# Patient Record
Sex: Male | Born: 1965 | Hispanic: No | Smoking: Never smoker
Health system: Southern US, Community
[De-identification: ages and names within clinical notes are randomized; demographics above are authoritative.]

## PROBLEM LIST (undated history)

## (undated) DIAGNOSIS — I10 Essential (primary) hypertension: Secondary | ICD-10-CM

## (undated) DIAGNOSIS — F32A Depression, unspecified: Secondary | ICD-10-CM

## (undated) DIAGNOSIS — F329 Major depressive disorder, single episode, unspecified: Secondary | ICD-10-CM

## (undated) DIAGNOSIS — E785 Hyperlipidemia, unspecified: Secondary | ICD-10-CM

## (undated) DIAGNOSIS — F419 Anxiety disorder, unspecified: Secondary | ICD-10-CM

## (undated) DIAGNOSIS — E119 Type 2 diabetes mellitus without complications: Secondary | ICD-10-CM

## (undated) DIAGNOSIS — M549 Dorsalgia, unspecified: Secondary | ICD-10-CM

## (undated) DIAGNOSIS — K219 Gastro-esophageal reflux disease without esophagitis: Secondary | ICD-10-CM

## (undated) HISTORY — DX: Type 2 diabetes mellitus without complications: E11.9

## (undated) HISTORY — DX: Essential (primary) hypertension: I10

## (undated) HISTORY — PX: OTHER SURGICAL HISTORY: SHX169

## (undated) HISTORY — DX: Dorsalgia, unspecified: M54.9

## (undated) HISTORY — DX: Hyperlipidemia, unspecified: E78.5

## (undated) HISTORY — DX: Depression, unspecified: F32.A

## (undated) HISTORY — DX: Gastro-esophageal reflux disease without esophagitis: K21.9

## (undated) HISTORY — DX: Anxiety disorder, unspecified: F41.9

## (undated) HISTORY — DX: Major depressive disorder, single episode, unspecified: F32.9

---

## 2009-01-23 ENCOUNTER — Ambulatory Visit: Payer: Self-pay | Admitting: Physician Assistant

## 2011-03-15 ENCOUNTER — Ambulatory Visit: Payer: Self-pay | Admitting: Family Medicine

## 2011-10-17 ENCOUNTER — Emergency Department: Payer: Self-pay | Admitting: *Deleted

## 2011-10-17 LAB — COMPREHENSIVE METABOLIC PANEL
Alkaline Phosphatase: 55 U/L (ref 50–136)
Anion Gap: 11 (ref 7–16)
Bilirubin,Total: 0.3 mg/dL (ref 0.2–1.0)
Calcium, Total: 9.1 mg/dL (ref 8.5–10.1)
Chloride: 103 mmol/L (ref 98–107)
Co2: 26 mmol/L (ref 21–32)
Creatinine: 1.59 mg/dL — ABNORMAL HIGH (ref 0.60–1.30)
SGOT(AST): 34 U/L (ref 15–37)
SGPT (ALT): 36 U/L
Sodium: 140 mmol/L (ref 136–145)

## 2011-10-17 LAB — URINALYSIS, COMPLETE: RBC,UR: 6 /HPF (ref 0–5)

## 2011-10-17 LAB — CBC
MCV: 86 fL (ref 80–100)
Platelet: 275 10*3/uL (ref 150–440)
RBC: 4.66 10*6/uL (ref 4.40–5.90)
RDW: 13.5 % (ref 11.5–14.5)
WBC: 10.1 10*3/uL (ref 3.8–10.6)

## 2011-10-17 LAB — LIPASE, BLOOD: Lipase: 215 U/L (ref 73–393)

## 2011-10-19 ENCOUNTER — Ambulatory Visit: Payer: Self-pay | Admitting: Urology

## 2011-10-19 LAB — BASIC METABOLIC PANEL
Anion Gap: 8 (ref 7–16)
BUN: 16 mg/dL (ref 7–18)
Calcium, Total: 9 mg/dL (ref 8.5–10.1)
Co2: 27 mmol/L (ref 21–32)
Creatinine: 0.89 mg/dL (ref 0.60–1.30)
EGFR (African American): >60
EGFR (Non-African Amer.): >60
Sodium: 140 mmol/L (ref 136–145)

## 2011-10-19 LAB — URINE CULTURE

## 2011-11-06 ENCOUNTER — Ambulatory Visit: Payer: Self-pay | Admitting: Urology

## 2011-11-30 ENCOUNTER — Ambulatory Visit: Payer: Self-pay | Admitting: Urology

## 2011-11-30 LAB — CREATININE, SERUM
Creatinine: 0.97 mg/dL (ref 0.60–1.30)
EGFR (African American): >60
EGFR (Non-African Amer.): >60

## 2012-01-17 ENCOUNTER — Ambulatory Visit: Payer: Self-pay | Admitting: Family Medicine

## 2012-02-22 ENCOUNTER — Ambulatory Visit: Payer: Self-pay | Admitting: Urology

## 2013-06-22 IMAGING — CT CT ABD-PELV W/O CM
1 of 2 series · 15 of 32 positions shown, 19 images · non-contrast
Comparison: none

REASON FOR EXAM: (1) right flank pain; (2) right flank pain
COMMENTS:   May transport without cardiac monitor

[Series 2: soft tissue · axial · 0.81mm/px · z∈[-52,+410]mm · 15 of 170 slices shown, 19 images]
[im 8/170  soft-tissue]
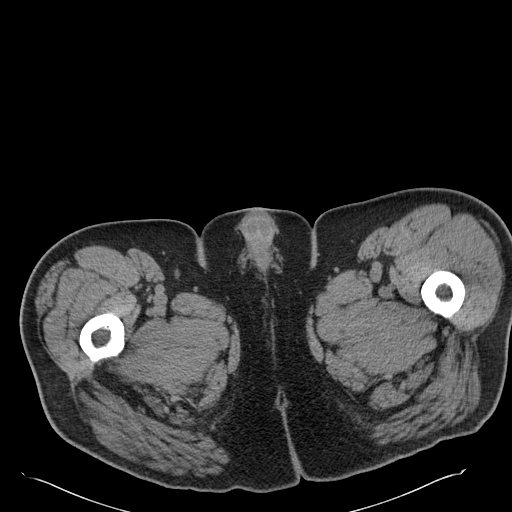
[im 8/170  bone]
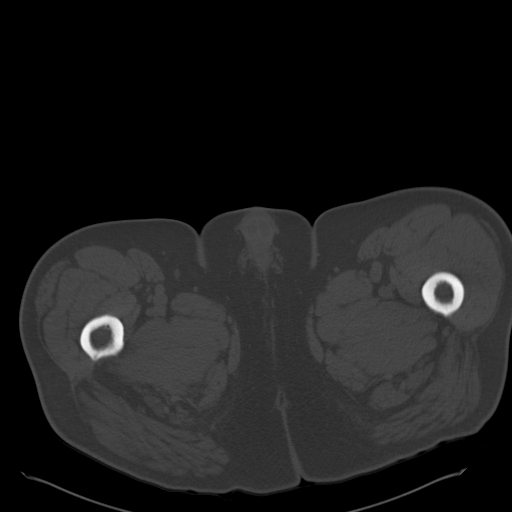
[im 22/170  soft-tissue]
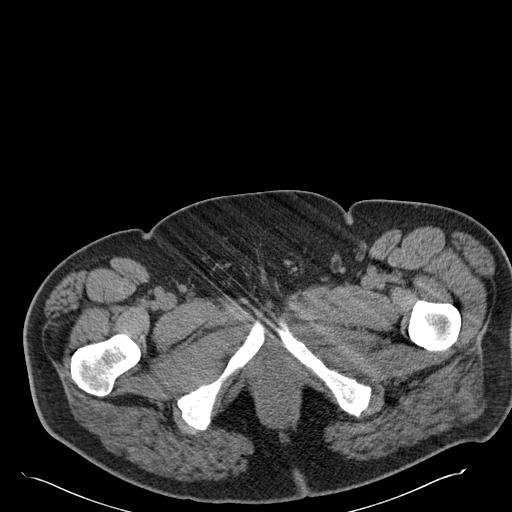
[im 36/170  soft-tissue]
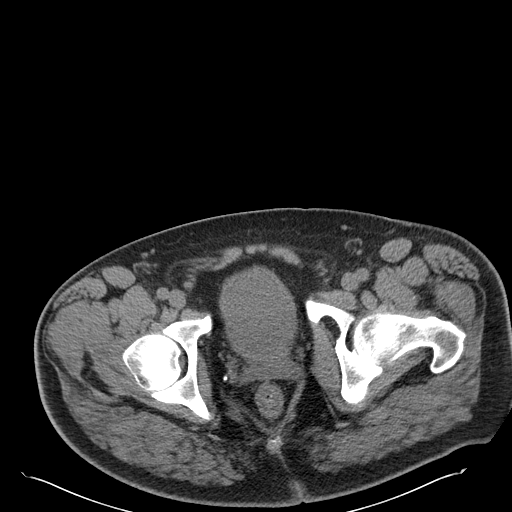
[im 50/170  soft-tissue]
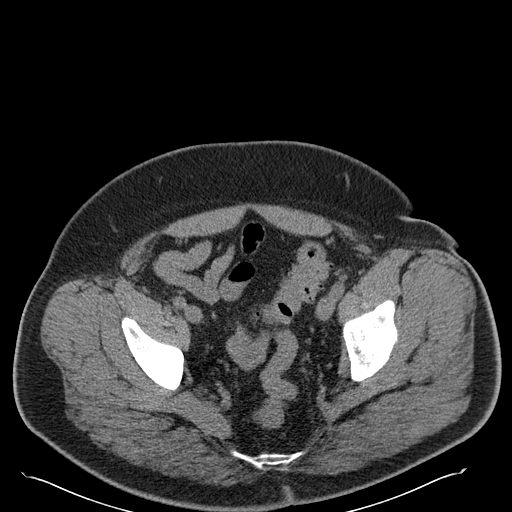
[im 57/170  soft-tissue]
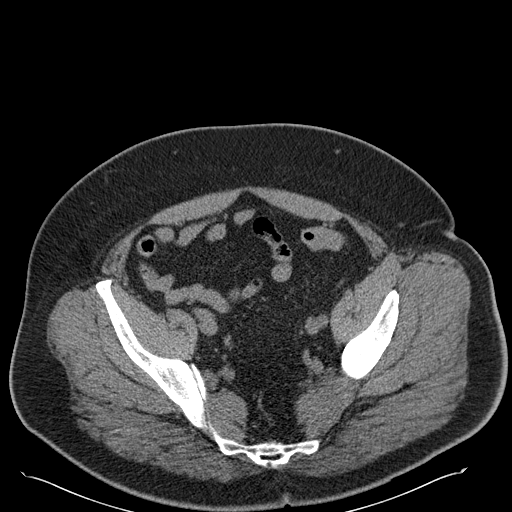
[im 71/170  soft-tissue]
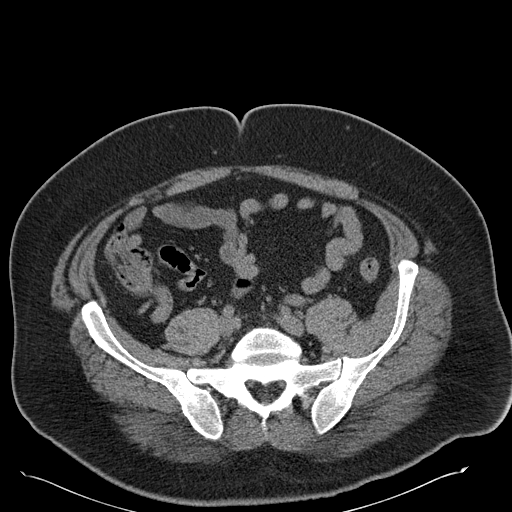
[im 85/170  soft-tissue]
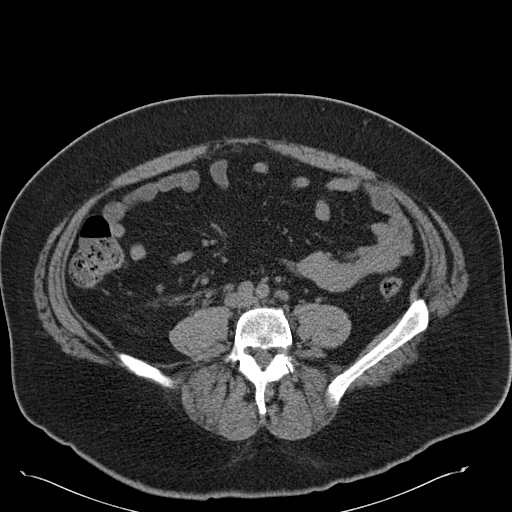
[im 99/170  soft-tissue]
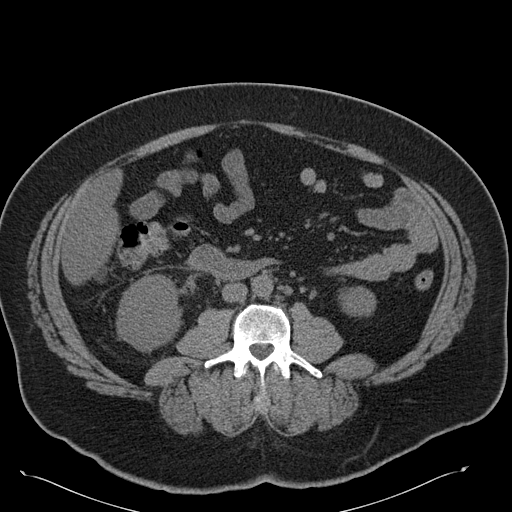
[im 113/170  soft-tissue]
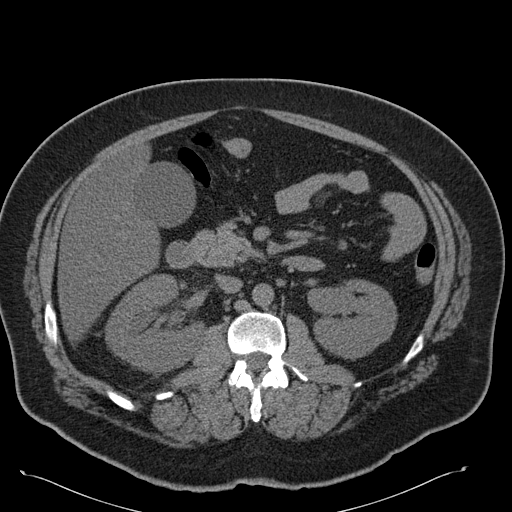
[im 113/170  bone]
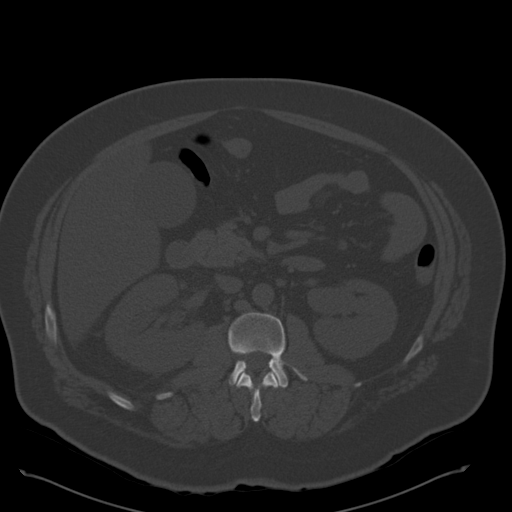
[im 120/170  soft-tissue]
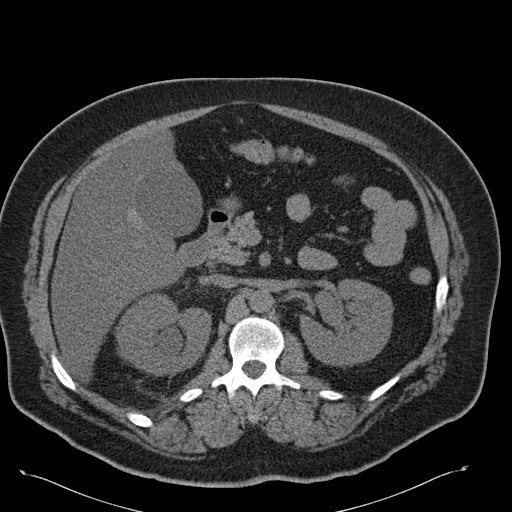
[im 134/170  soft-tissue]
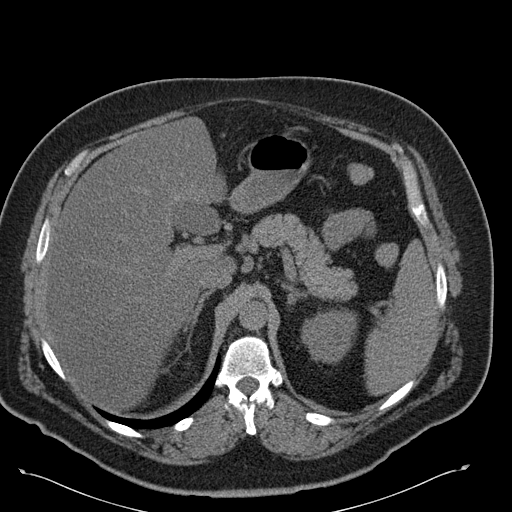
[im 141/170  lung]
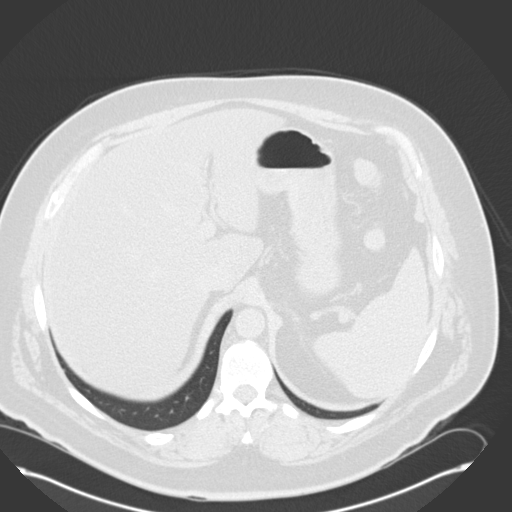
[im 148/170  soft-tissue]
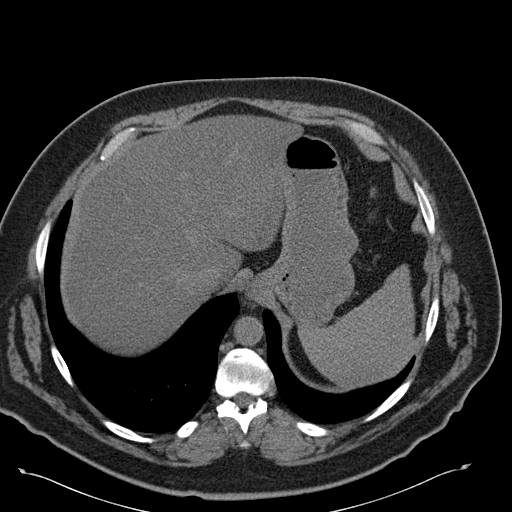
[im 148/170  lung]
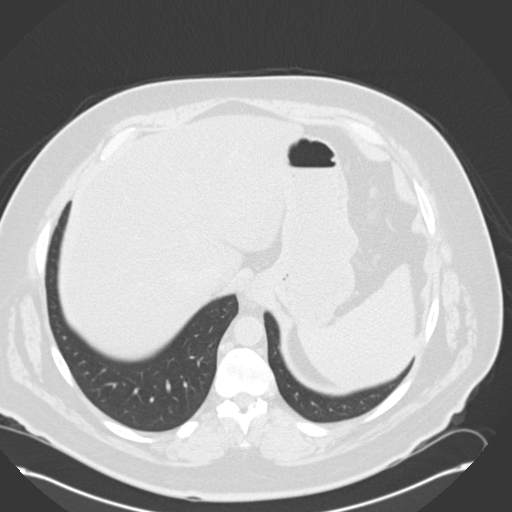
[im 155/170  lung]
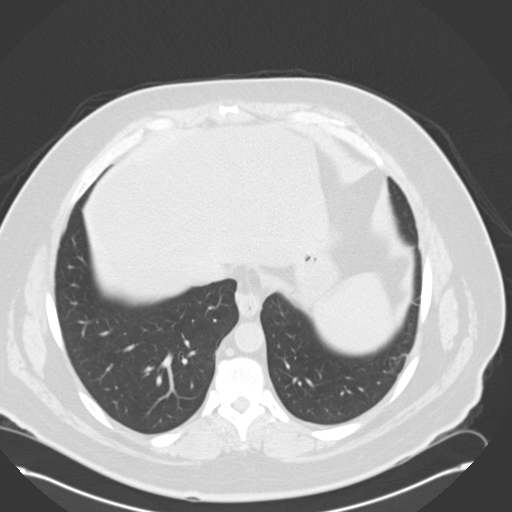
[im 162/170  soft-tissue]
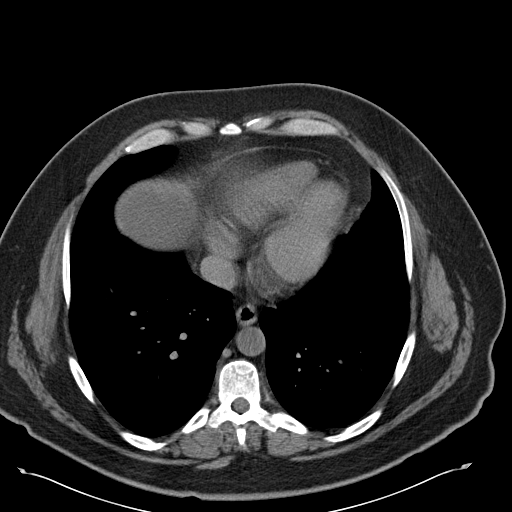
[im 162/170  lung]
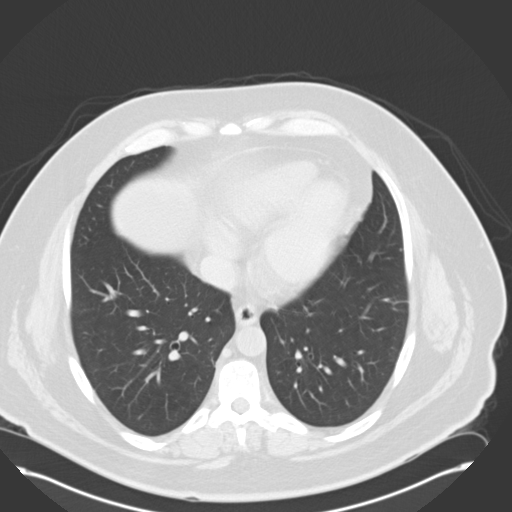

[15 of 32 positions shown; findings below may reference images not displayed]

PROCEDURE:     CT  - CT ABDOMEN AND PELVIS W[DATE]  [DATE]

RESULT:     Axial noncontrast CT scanning was performed through the abdomen
and pelvis with reconstructions at 3 mm intervals and slice thicknesses.
Review of multiplanar reconstructed images was performed separately on the
VIA monitor.

There is bilateral moderate hydronephrosis. On the right a stone measuring
10 mm in diameter is seen at the ureteropelvic junction. On the left there
is a stone at the ureteropelvic junction measuring 8 mm in diameter. The
hydronephrosis on the right is likely acute given the increased density in
the perinephric fat. On the left it is not clearly acute. More distally the
ureters exhibit no evidence of stones or obstruction. The partially
distended urinary bladder is normal in appearance.

The liver exhibits decreased density diffusely consistent with fatty
infiltration. The gallbladder is adequately distended with no evidence of
stones. The spleen, partially distended stomach, pancreas, and adrenal
glands are normal in appearance. The caliber of the abdominal aorta is
normal. A normal appendix is demonstrated. The unopacified loops of small
and large bowel exhibit no evidence of ileus nor of obstruction. A few
sigmoid diverticula are demonstrated.

The lung bases are clear. The lumbar vertebral bodies are preserved in
height. There is mild to space narrowing at L4-L5.
IMPRESSION: 1. There is bilateral moderate hydronephrosis. This appears to be acute or
subacute on the right given the increased density in the perinephric fat. A
10 mm diameter stone is noted the right ureteropelvic junction. There are
other nonobstructing stones in the right kidney. The left kidney
demonstrates an 8 mm diameter stone which appears to be partially
obstructing the ureteropelvic junction, but there is no increased
perinephric fat density.
2. There is fatty infiltrative change of the liver.
3. I see no acute bowel abnormality.

## 2015-01-17 NOTE — Op Note (Signed)
PATIENT NAME:  Randy Kim, Randy Kim MR#:  [id number] DATE OF BIRTH:  12-11-1965  DATE OF PROCEDURE:  10/19/2011  PREOPERATIVE DIAGNOSES:  1. Right ureteropelvic junction stone  2. Left proximal ureteral calculus   POSTOPERATIVE DIAGNOSES:  1. Right ureteropelvic junction stone.  2. Left renal calculus.   PROCEDURES:  1. Right ureteroscopy with intracorporal laser lithotripsy/stone extraction.  2. Placement of right ureteral stent.  3. Left retrograde pyelogram.   SURGEON: Scott C. Lonna Cobb, M.D.   ASSISTANT: None.   ANESTHESIA: General.   INDICATIONS: A 49 year old male who presented to the Emergency Department on 10/17/2011 with severe right renal colic which was controlled with parenteral analgesics. CT scan demonstrated a 10 mm right UPJ stone and a 6 millimeter left proximal ureteral calculus with bilateral hydronephrosis. His creatinine was 1.6. After a discussion of the treatment options, he desires an attempt at ureteroscopic removal of his larger right and symptomatic right UPJ stone and possible left ureteral stent placement although he has been asymptomatic on that side. Repeat creatinine the day of surgery was 0.89.   DESCRIPTION OF PROCEDURE: The patient was taken to the Operating Room where a general anesthetic was administered. He was placed in the low lithotomy position and his external genitalia were prepped and draped sterilely. Time-out was performed per protocol. A 21 French cystoscope with 30 degrees lens was lubricated and passed under direct vision. The urethra was normal in caliber without stricture. Prostate was not lucid. Bladder mucosa was closely inspected and shows no erythema, solid or papillary lesions. The ureteral orifices were normal appearing bilaterally with clear efflux noted. A 0.035 guidewire was placed through the working channel of the cystoscope and into the right ureteral orifice and passed up to the renal pelvis under fluoroscopic guidance without  difficulty. The cystoscope was removed and a 6 French semirigid ureteroscope was passed under direct vision. The right ureteral orifice was easily engaged without dilation. The scope was advanced up to the ureteropelvic junction and no stone was identified. A second guidewire was placed and the semirigid ureteroscope was removed. A ureteral access sheath was placed over the second wire and the wire was removed. A #8 [place] digital flexible ureteroscope was placed through the access sheath, advanced up to the renal pelvis where the 10 mm stone was identified. A 273 micron holmium laser fiber was placed through the ureteroscope. The stone was fragmented into three pieces. An Escape laser basket was placed through the ureteroscope and one of the fragments was upstirred and brought to the proximal ureter, however, could not be advanced beyond this area. The holmium laser fiber was placed through the ureteroscope and the stone was further fragmented. The additional two fragments were removed in a similar fashion. Ureteroscope was passed up back into the renal pelvis. Calyces were examined and no larger fragments were seen either on fluoroscopy or under direct vision. The ureteroscope was slowly withdrawn. An area of the proximal ureter where the stone was restricted, ureteral perforation was noted. The ureteroscope and access sheath were removed. A 6 French open-ended ureteral catheter was placed over the guidewire into the renal pelvis. A retrograde pyelogram confirms proper location of the catheter. A guidewire was replaced and a 6 French/24 cm Bard stent was placed. There was good curl seen in the renal pelvis under fluoroscopy. The cystoscope was repassed and the distal end of the stent was well positioned in the bladder. A 6 French open-ended ureteral catheter was then placed through the cystoscope. The catheter was  placed at the left ureteral orifice and retrograde pyelogram showed that the left proximal stone had  migrated back into the kidney. No hydronephrosis was noted. Since he was asymptomatic on that side, it was elected not to place a stent. The bladder was emptied and the cystoscope was removed. A B and O suppository was placed per rectum. He was taken to the postanesthesia care unit in stable condition.   ESTIMATED BLOOD LOSS: Minimal.  ____________________________ Verna CzechScott C. Lonna CobbStoioff, MD scs:ap D: 10/20/2011 07:51:40 ET T: 10/20/2011 08:45:59 ET JOB#: [id number]  cc: Lorin PicketScott C. Lonna CobbStoioff, MD, <Dictator> Riki AltesSCOTT C STOIOFF MD ELECTRONICALLY SIGNED 11/08/2011 8:41

## 2015-03-15 ENCOUNTER — Ambulatory Visit (INDEPENDENT_AMBULATORY_CARE_PROVIDER_SITE_OTHER): Payer: Managed Care, Other (non HMO) | Admitting: Family Medicine

## 2015-03-15 ENCOUNTER — Encounter: Payer: Self-pay | Admitting: Family Medicine

## 2015-03-15 VITALS — BP 110/70 | HR 87 | Temp 98.0°F | Resp 18 | Ht 69.0 in | Wt 253.2 lb

## 2015-03-15 DIAGNOSIS — I10 Essential (primary) hypertension: Secondary | ICD-10-CM

## 2015-03-15 DIAGNOSIS — E789 Disorder of lipoprotein metabolism, unspecified: Secondary | ICD-10-CM | POA: Insufficient documentation

## 2015-03-15 DIAGNOSIS — E119 Type 2 diabetes mellitus without complications: Secondary | ICD-10-CM | POA: Insufficient documentation

## 2015-03-15 DIAGNOSIS — L559 Sunburn, unspecified: Secondary | ICD-10-CM

## 2015-03-15 LAB — POCT UA - MICROALBUMIN: Microalbumin Ur, POC: 100 mg/L

## 2015-03-15 LAB — GLUCOSE, POCT (MANUAL RESULT ENTRY): POC Glucose: 166 mg/dl — AB (ref 70–99)

## 2015-03-15 LAB — POCT GLYCOSYLATED HEMOGLOBIN (HGB A1C): HEMOGLOBIN A1C: 8.8

## 2015-03-15 MED ORDER — TRIAMCINOLONE ACETONIDE 0.1 % EX CREA: 1.0000 "application " | TOPICAL_CREAM | Freq: Two times a day (BID) | CUTANEOUS | Status: DC

## 2015-03-15 MED ORDER — HYDROCODONE-ACETAMINOPHEN 10-325 MG PO TABS: 1.0000 | ORAL_TABLET | Freq: Three times a day (TID) | ORAL | Status: DC

## 2015-03-15 NOTE — Progress Notes (Signed)
Name: Randy Kim   MRN: [id number]    DOB: 05-Aug-1966   Date:03/15/2015       Progress Note  Subjective  Chief Complaint  Chief Complaint  Patient presents with  . Hypertension  . Diabetes  . Hyperlipidemia  . Pain    2 month recheck  . Sunburn    itching    Hypertension This is a chronic problem. The current episode started more than 1 year ago. The problem is unchanged. The problem is controlled. Associated symptoms include anxiety, headaches, neck pain and palpitations. Pertinent negatives include no blurred vision, chest pain, orthopnea or shortness of breath. There are no associated agents to hypertension. Risk factors for coronary artery disease include diabetes mellitus, dyslipidemia, male gender, obesity, sedentary lifestyle and stress. Past treatments include direct vasodilators and angiotensin blockers. There are no compliance problems.   Diabetes Hypoglycemia symptoms include headaches. Pertinent negatives for hypoglycemia include no dizziness, nervousness/anxiousness, seizures or tremors. Pertinent negatives for diabetes include no blurred vision, no chest pain, no weakness and no weight loss.  Hyperlipidemia This is a chronic problem. The current episode started more than 1 year ago. The problem is uncontrolled. Recent lipid tests were reviewed and are high. Exacerbating diseases include obesity. Factors aggravating his hyperlipidemia include fatty foods. Pertinent negatives include no chest pain, focal weakness, myalgias or shortness of breath. Current antihyperlipidemic treatment includes statins and fibric acid derivatives. The current treatment provides mild improvement of lipids. Risk factors for coronary artery disease include diabetes mellitus, dyslipidemia, hypertension, male sex, obesity, a sedentary lifestyle and stress.  Back Pain This is a chronic problem. The current episode started more than 1 year ago. The problem occurs daily. The problem is unchanged. The  pain is present in the lumbar spine. The quality of the pain is described as aching and burning. The pain radiates to the left thigh and right thigh. The pain is moderate. The pain is worse during the day. The symptoms are aggravated by bending, coughing, twisting and stress. Associated symptoms include headaches. Pertinent negatives include no bladder incontinence, bowel incontinence, chest pain, dysuria, fever, tingling, weakness or weight loss. Risk factors include obesity and sedentary lifestyle. He has tried NSAIDs, muscle relaxant and analgesics for the symptoms. The treatment provided mild relief.    Sunburn.  Patient was out of doors for a longer period than expected experience sunburn. He has a burning and itching sensation over his trunk and upper and lower extremities. He's use over-the-counter hydrocortisone cream without relief. He is diabetic and therefore steroids will not be used orally.  Past Medical History  Diagnosis Date  . Depression   . Anxiety   . Diabetes mellitus without complication   . Hyperlipidemia   . Hypertension   . Back pain   . GERD (gastroesophageal reflux disease)     History  Substance Use Topics  . Smoking status: Never Smoker   . Smokeless tobacco: Not on file  . Alcohol Use: Not on file     Current outpatient prescriptions:  .  ALPRAZolam (XANAX) 1 MG tablet, , Disp: , Rfl:  .  ARIPiprazole (ABILIFY) 10 MG tablet, , Disp: , Rfl:  .  cyclobenzaprine (FLEXERIL) 10 MG tablet, , Disp: , Rfl:  .  HYDROcodone-acetaminophen (NORCO) 10-325 MG per tablet, , Disp: , Rfl:  .  losartan (COZAAR) 100 MG tablet, , Disp: , Rfl:  .  LYRICA 150 MG capsule, , Disp: , Rfl:  .  metoprolol (LOPRESSOR) 100 MG tablet, , Disp: ,  Rfl:  .  mirtazapine (REMERON) 15 MG tablet, , Disp: , Rfl:  .  ONGLYZA 5 MG TABS tablet, , Disp: , Rfl:  .  rizatriptan (MAXALT) 10 MG tablet, , Disp: , Rfl:  .  sertraline (ZOLOFT) 100 MG tablet, , Disp: , Rfl:  .  temazepam (RESTORIL)  30 MG capsule, , Disp: , Rfl:   No Known Allergies  Review of Systems  Constitutional: Negative for fever, chills and weight loss.  HENT: Negative for congestion, hearing loss, sore throat and tinnitus.   Eyes: Negative for blurred vision, double vision and redness.  Respiratory: Negative for cough, hemoptysis and shortness of breath.   Cardiovascular: Positive for palpitations. Negative for chest pain, orthopnea, claudication and leg swelling.  Gastrointestinal: Negative for heartburn, nausea, vomiting, diarrhea, constipation, blood in stool and bowel incontinence.  Genitourinary: Negative for bladder incontinence, dysuria, urgency, frequency and hematuria.  Musculoskeletal: Positive for back pain, joint pain and neck pain. Negative for myalgias.  Skin: Positive for rash. Negative for itching.       Sunburn  Neurological: Positive for headaches. Negative for dizziness, tingling, tremors, focal weakness, seizures, loss of consciousness and weakness.  Endo/Heme/Allergies: Does not bruise/bleed easily.  Psychiatric/Behavioral: Positive for depression. Negative for substance abuse. The patient has insomnia. The patient is not nervous/anxious.      Objective  Filed Vitals:   03/15/15 1431  BP: 110/70  Pulse: 87  Temp: 98 F (36.7 C)  TempSrc: Oral  Resp: 18  Height:  (1.753 m)  Weight: 253 lb 3.2 oz (114.851 kg)  SpO2: 99%     Physical Exam  Constitutional: He is oriented to person, place, and time and well-developed, well-nourished, and in no distress.  Obese  HENT:  Head: Normocephalic.  Eyes: EOM are normal. Pupils are equal, round, and reactive to light.  Neck: Normal range of motion. Neck supple. No thyromegaly present.  Cardiovascular: Normal rate, regular rhythm and normal heart sounds.   No murmur heard. Pulmonary/Chest: Effort normal and breath sounds normal. No respiratory distress. He has no wheezes.  Abdominal: Soft. Bowel sounds are normal.   Musculoskeletal: He exhibits no edema. Tenderness: low back.  Lymphadenopathy:    He has no cervical adenopathy.  Neurological: He is alert and oriented to person, place, and time. No cranial nerve deficit. Gait normal. Coordination normal.  Skin: Skin is warm and dry. No rash noted. Erythema: consistent with first-degree sunburn.  Psychiatric: Affect and judgment normal.      Assessment & Plan  1. Type 2 diabetes mellitus without complication Poorly controlled - POCT Glucose (CBG) - POCT HgB A1C - POCT UA - Microalbumin  2. Lipid disorder Poorly controlled - Lipid Profile - COMPLETE METABOLIC PANEL WITH GFR - TSH  3. Essential hypertension Well-controlled  4. Sunburn Aristocort cream

## 2015-03-15 NOTE — Patient Instructions (Signed)
F/U 3 MO 

## 2015-03-17 ENCOUNTER — Other Ambulatory Visit: Payer: Self-pay | Admitting: Family Medicine

## 2015-04-15 ENCOUNTER — Other Ambulatory Visit: Payer: Self-pay

## 2015-04-15 MED ORDER — ZOLPIDEM TARTRATE ER 12.5 MG PO TBCR: 12.5000 mg | EXTENDED_RELEASE_TABLET | Freq: Every evening | ORAL | Status: DC | PRN

## 2015-05-26 ENCOUNTER — Other Ambulatory Visit: Payer: Self-pay

## 2015-05-26 ENCOUNTER — Other Ambulatory Visit: Payer: Self-pay | Admitting: Family Medicine

## 2015-05-26 MED ORDER — LYRICA 150 MG PO CAPS: 150.0000 mg | ORAL_CAPSULE | Freq: Two times a day (BID) | ORAL | Status: DC

## 2015-05-26 MED ORDER — ZOLPIDEM TARTRATE ER 12.5 MG PO TBCR: 12.5000 mg | EXTENDED_RELEASE_TABLET | Freq: Every evening | ORAL | Status: AC | PRN

## 2015-06-10 ENCOUNTER — Other Ambulatory Visit: Payer: Self-pay | Admitting: Family Medicine

## 2015-06-10 MED ORDER — LYRICA 150 MG PO CAPS: 150.0000 mg | ORAL_CAPSULE | Freq: Two times a day (BID) | ORAL | Status: AC

## 2015-06-14 ENCOUNTER — Telehealth: Payer: Self-pay

## 2015-06-14 ENCOUNTER — Other Ambulatory Visit: Payer: Self-pay

## 2015-06-14 MED ORDER — HYDROCODONE-ACETAMINOPHEN 10-325 MG PO TABS: 1.0000 | ORAL_TABLET | Freq: Three times a day (TID) | ORAL | Status: DC

## 2015-06-14 NOTE — Telephone Encounter (Signed)
Pt needs refill on hydrocodone, will be out tomorrow. Can you write a script for enough for 1 week until pt can get back to see [facility]?

## 2015-06-14 NOTE — Telephone Encounter (Signed)
Refill for hydrocodone-acetaminophen 10-325 mg 1 tablet 3 times daily as needed authorized for 7 days until the patient can see his primary care provider

## 2015-06-15 ENCOUNTER — Ambulatory Visit: Payer: Managed Care, Other (non HMO) | Admitting: Family Medicine

## 2015-06-17 ENCOUNTER — Ambulatory Visit: Payer: Managed Care, Other (non HMO) | Admitting: Family Medicine

## 2015-06-22 ENCOUNTER — Ambulatory Visit (INDEPENDENT_AMBULATORY_CARE_PROVIDER_SITE_OTHER): Payer: Managed Care, Other (non HMO) | Admitting: Family Medicine

## 2015-06-22 ENCOUNTER — Encounter: Payer: Self-pay | Admitting: Family Medicine

## 2015-06-22 VITALS — BP 128/84 | HR 88 | Temp 98.1°F | Resp 18 | Ht 69.0 in | Wt 243.2 lb

## 2015-06-22 DIAGNOSIS — E1165 Type 2 diabetes mellitus with hyperglycemia: Secondary | ICD-10-CM | POA: Diagnosis not present

## 2015-06-22 DIAGNOSIS — N183 Chronic kidney disease, stage 3 (moderate): Secondary | ICD-10-CM | POA: Diagnosis not present

## 2015-06-22 DIAGNOSIS — F419 Anxiety disorder, unspecified: Secondary | ICD-10-CM | POA: Diagnosis not present

## 2015-06-22 DIAGNOSIS — I10 Essential (primary) hypertension: Secondary | ICD-10-CM

## 2015-06-22 DIAGNOSIS — G894 Chronic pain syndrome: Secondary | ICD-10-CM

## 2015-06-22 DIAGNOSIS — F338 Other recurrent depressive disorders: Secondary | ICD-10-CM | POA: Diagnosis not present

## 2015-06-22 DIAGNOSIS — E785 Hyperlipidemia, unspecified: Secondary | ICD-10-CM | POA: Diagnosis not present

## 2015-06-22 DIAGNOSIS — E1122 Type 2 diabetes mellitus with diabetic chronic kidney disease: Secondary | ICD-10-CM

## 2015-06-22 DIAGNOSIS — IMO0002 Reserved for concepts with insufficient information to code with codable children: Secondary | ICD-10-CM

## 2015-06-22 LAB — POCT GLYCOSYLATED HEMOGLOBIN (HGB A1C): Hemoglobin A1C: 9.1

## 2015-06-22 LAB — GLUCOSE, POCT (MANUAL RESULT ENTRY): POC Glucose: 218 mg/dl — AB (ref 70–99)

## 2015-06-22 MED ORDER — HYDROCODONE-ACETAMINOPHEN 10-325 MG PO TABS: 1.0000 | ORAL_TABLET | Freq: Three times a day (TID) | ORAL | Status: AC

## 2015-06-22 MED ORDER — HYDROCODONE-ACETAMINOPHEN 10-325 MG PO TABS: 1.0000 | ORAL_TABLET | Freq: Three times a day (TID) | ORAL | Status: DC

## 2015-06-22 MED ORDER — EXENATIDE ER 2 MG ~~LOC~~ PEN: 2.0000 mg | PEN_INJECTOR | Freq: Once | SUBCUTANEOUS | Status: AC

## 2015-06-22 NOTE — Progress Notes (Signed)
Name: Randy Kim   MRN: [id number]    DOB: [birth year]   Date:06/22/2015       Progress Note  Subjective  Chief Complaint  Chief Complaint  Patient presents with  . Hypertension    3 month follow up  . Diabetes  . Hyperlipidemia  . Back Pain    Chronic Med refill    HPI  Diabetes with chronic kidney disease  Patient presents for follow-up of diabetes which is present for over 5 years. Is currently on a regimen of Onglyza 5 mg daily and metformin thousand milligrams twice a day . Patient states variable compliance with their diet and exercise. There's been no hypoglycemic episodes and there is no polyuria polydipsia polyphagia. His average fasting glucoses been in the low around not being checked with a high around not being checked. There is no end organ disease.  Last diabetic eye exam was earlier this year over year ago.   Last visit with dietitian was over year ago. Last microalbumin was obtained 03/15/2015 and was elevated at 100 .   Hypertension history of present illness  Hyperlipidemia  Patient has a history of hyperlipidemia for over 5 years.  Current medical regimen consist of atorvastatin 40 mg daily at bedtime  Compliance is good .  Diet and exercise are currently followed sporadically .  Risk factors for cardiovascular disease include hyperlipidemia , hypertension, diabetes, obesity, sedentary lifestyle .   There have been no side effects from the medication.    Chronic pain  Patient presents for follow-up of chronic pain syndrome. The pain score at worse is 9/10  and at best is 5/10 with medication rest and home remedies.  There is no evidence of any extra dosage or issues of usage outside of the prescribed regimen.  Pain is exacerbated by changes in weather and by strenuous activities. There have been no recent activities to exacerbate the chronic pain. Concomitant medications include Lyrica 150 mg daily along with his Norco .  Drug screen and medication contract have  been renewed within the last 1 year.  Chronic kidney disease  Major depressive disorder recurrent  Patient is currently followed by a psychiatrist for his major depression. It is currently mild to moderate in severity and is most recently been on Zoloft with Abilify as well as alprazolam and Remeron.  Past Medical History  Diagnosis Date  . Depression   . Anxiety   . Diabetes mellitus without complication   . Hyperlipidemia   . Hypertension   . Back pain   . GERD (gastroesophageal reflux disease)     Social History  Substance Use Topics  . Smoking status: Never Smoker   . Smokeless tobacco: Not on file  . Alcohol Use: No     Current outpatient prescriptions:  .  atorvastatin (LIPITOR) 40 MG tablet, , Disp: , Rfl:  .  cyclobenzaprine (FLEXERIL) 10 MG tablet, , Disp: , Rfl:  .  fenofibrate (TRICOR) 145 MG tablet, TAKE ONE TABLET BY MOUTH EVERY DAY, Disp: 30 tablet, Rfl: 2 .  HYDROcodone-acetaminophen (NORCO) 10-325 MG per tablet, Take 1 tablet by mouth 3 (three) times daily., Disp: 21 tablet, Rfl: 0 .  losartan (COZAAR) 100 MG tablet, , Disp: , Rfl:  .  LYRICA 150 MG capsule, Take 1 capsule (150 mg total) by mouth 2 (two) times daily., Disp: 60 capsule, Rfl: 5 .  metoprolol (LOPRESSOR) 100 MG tablet, TAKE ONE TABLET BY MOUTH EVERY DAY, Disp: 30 tablet, Rfl: 5 .  ONGLYZA  5 MG TABS tablet, , Disp: , Rfl:  .  rizatriptan (MAXALT) 10 MG tablet, , Disp: , Rfl:  .  zolpidem (AMBIEN CR) 12.5 MG CR tablet, Take 1 tablet (12.5 mg total) by mouth at bedtime as needed for sleep., Disp: 30 tablet, Rfl: 2  No Known Allergies  Review of Systems  Constitutional: Negative for fever, chills and weight loss.  HENT: Negative for congestion, hearing loss, sore throat and tinnitus.   Eyes: Negative for blurred vision, double vision and redness.  Respiratory: Negative for cough, hemoptysis and shortness of breath.   Cardiovascular: Positive for leg swelling. Negative for chest pain, palpitations,  orthopnea and claudication.  Gastrointestinal: Negative for heartburn, nausea, vomiting, diarrhea, constipation and blood in stool.  Genitourinary: Negative for dysuria, urgency, frequency and hematuria.  Musculoskeletal: Positive for back pain and joint pain. Negative for myalgias, falls and neck pain.  Skin: Negative for itching.  Neurological: Negative for dizziness, tingling, tremors, focal weakness, seizures, loss of consciousness, weakness and headaches.  Endo/Heme/Allergies: Does not bruise/bleed easily.  Psychiatric/Behavioral: Positive for depression. Negative for substance abuse. The patient is nervous/anxious and has insomnia.      Objective  Filed Vitals:   06/22/15 0834  BP: 128/84  Pulse: 88  Temp: 98.1 F (36.7 C)  TempSrc: Oral  Resp: 18  Height:  (1.753 m)  Weight: 243 lb 3.2 oz (110.315 kg)  SpO2: 98%     Physical Exam  Constitutional: He is oriented to person, place, and time.  Obese male in no acute distress  HENT:  Head: Normocephalic.  Eyes: EOM are normal. Pupils are equal, round, and reactive to light.  Neck: Normal range of motion. Neck supple. No thyromegaly present.  Cardiovascular: Normal rate, regular rhythm, normal heart sounds and intact distal pulses.   No murmur heard. Pulmonary/Chest: Effort normal and breath sounds normal. No respiratory distress. He has no wheezes.  Musculoskeletal: Normal range of motion. He exhibits edema.  Lymphadenopathy:    He has no cervical adenopathy.  Neurological: He is alert and oriented to person, place, and time. No cranial nerve deficit. Gait normal. Coordination normal.  Skin: Skin is warm and dry. No rash noted.  Psychiatric: Affect and judgment normal.      Assessment & Plan  1. Uncontrolled type 2 diabetes with stage 3 chronic kidney disease GFR 30-59 Stable - POCT HgB A1C - POCT Glucose (CBG) - TSH - Ambulatory referral to Ophthalmology By [place] 2 Mg Every Week  2.  Hyperlipidemia Labs today - Lipid panel - TSH  3. Essential hypertension Well-controlled - Comprehensive Metabolic Panel (CMET) - TSH  4. Acute anxiety Per psychiatrist - TSH  5. Chronic pain syndrome Renew Norco  6. Recurrent brief depressive disorder Per psychiatrist

## 2015-09-08 ENCOUNTER — Other Ambulatory Visit: Payer: Self-pay | Admitting: Family Medicine

## 2015-09-13 ENCOUNTER — Encounter: Payer: Self-pay | Admitting: Family Medicine

## 2015-09-13 ENCOUNTER — Ambulatory Visit (INDEPENDENT_AMBULATORY_CARE_PROVIDER_SITE_OTHER): Payer: Managed Care, Other (non HMO) | Admitting: Family Medicine

## 2015-09-13 VITALS — BP 128/84 | HR 110 | Temp 98.1°F | Resp 18 | Ht 69.0 in | Wt 240.6 lb

## 2015-09-13 DIAGNOSIS — E785 Hyperlipidemia, unspecified: Secondary | ICD-10-CM | POA: Diagnosis not present

## 2015-09-13 DIAGNOSIS — F419 Anxiety disorder, unspecified: Secondary | ICD-10-CM | POA: Diagnosis not present

## 2015-09-13 DIAGNOSIS — I10 Essential (primary) hypertension: Secondary | ICD-10-CM

## 2015-09-13 DIAGNOSIS — G894 Chronic pain syndrome: Secondary | ICD-10-CM | POA: Diagnosis not present

## 2015-09-13 MED ORDER — ALPRAZOLAM 1 MG PO TABS: 1.0000 mg | ORAL_TABLET | Freq: Three times a day (TID) | ORAL | Status: AC

## 2015-09-13 MED ORDER — HYDROCODONE-ACETAMINOPHEN 10-325 MG PO TABS: 1.0000 | ORAL_TABLET | Freq: Three times a day (TID) | ORAL | Status: DC

## 2015-09-13 MED ORDER — HYDROCODONE-ACETAMINOPHEN 10-325 MG PO TABS: 1.0000 | ORAL_TABLET | Freq: Three times a day (TID) | ORAL | Status: AC

## 2015-09-13 MED ORDER — ZOLPIDEM TARTRATE ER 12.5 MG PO TBCR: 12.5000 mg | EXTENDED_RELEASE_TABLET | Freq: Every evening | ORAL | Status: AC | PRN

## 2015-09-13 NOTE — Progress Notes (Signed)
Name: Randy Kim   MRN: [id number]    DOB: 1966/03/04   Date:09/13/2015       Progress Note  Subjective  Chief Complaint  Chief Complaint  Patient presents with  . Back Pain    Chronic Pain med refills  . Insomnia  . Anxiety    Would like refill on Alprazolam until he finds a Psychiatrist    HPI  Chronic pain  Patient presents for follow-up of chronic pain syndrome. The pain score at worse is 10/10  and at best is 6/10 with medication rest and home remedies.  There is no evidence of any extra dosage or issues of usage outside of the prescribed regimen.  Pain is exacerbated by changes in weather and by strenuous activities. There have been no recent activities to exacerbate the chronic pain. Concomitant medications include hydrocodone .  Drug screen and medication contract have been renewed within the last 1 year.    Insomnia  History of insomnia for over 5 years. Patient has been having his Ambien written by psychiatrist in the past but is unable to obtain a new psychiatrist since his last visit here. Insomnia is being effectively controlled by his Ambien.     Past Medical History  Diagnosis Date  . Depression   . Anxiety   . Diabetes mellitus without complication (HCC)   . Hyperlipidemia   . Hypertension   . Back pain   . GERD (gastroesophageal reflux disease)     Social History  Substance Use Topics  . Smoking status: Never Smoker   . Smokeless tobacco: Not on file  . Alcohol Use: No     Current outpatient prescriptions:  .  atorvastatin (LIPITOR) 40 MG tablet, , Disp: , Rfl:  .  cyclobenzaprine (FLEXERIL) 10 MG tablet, , Disp: , Rfl:  .  Exenatide ER (BYDUREON) 2 MG PEN, Inject 2 mg into the skin once., Disp: 4 each, Rfl: 5 .  fenofibrate (TRICOR) 145 MG tablet, TAKE ONE TABLET BY MOUTH EVERY DAY, Disp: 30 tablet, Rfl: 2 .  HYDROcodone-acetaminophen (NORCO) 10-325 MG per tablet, Take 1 tablet by mouth 3 (three) times daily., Disp: 93 tablet, Rfl: 0 .   HYDROcodone-acetaminophen (NORCO) 10-325 MG per tablet, Take 1 tablet by mouth 3 (three) times daily., Disp: 21 tablet, Rfl: 0 .  HYDROcodone-acetaminophen (NORCO) 10-325 MG per tablet, Take 1 tablet by mouth 3 (three) times daily., Disp: 93 tablet, Rfl: 0 .  HYDROcodone-acetaminophen (NORCO) 10-325 MG per tablet, Take 1 tablet by mouth 3 (three) times daily., Disp: 93 tablet, Rfl: 0 .  HYDROcodone-acetaminophen (NORCO) 10-325 MG per tablet, Take 1 tablet by mouth 3 (three) times daily., Disp: 21 tablet, Rfl: 0 .  HYDROcodone-acetaminophen (NORCO) 10-325 MG per tablet, Take 1 tablet by mouth 3 (three) times daily., Disp: 93 tablet, Rfl: 0 .  losartan (COZAAR) 100 MG tablet, , Disp: , Rfl:  .  LYRICA 150 MG capsule, Take 1 capsule (150 mg total) by mouth 2 (two) times daily., Disp: 60 capsule, Rfl: 5 .  metFORMIN (GLUCOPHAGE) 1000 MG tablet, TAKE ONE TABLET BY MOUTH TWICE A DAY, Disp: 60 tablet, Rfl: 5 .  metoprolol (LOPRESSOR) 100 MG tablet, TAKE ONE TABLET BY MOUTH EVERY DAY, Disp: 30 tablet, Rfl: 5 .  ONGLYZA 5 MG TABS tablet, , Disp: , Rfl:  .  rizatriptan (MAXALT) 10 MG tablet, , Disp: , Rfl:  .  zolpidem (AMBIEN CR) 12.5 MG CR tablet, Take 1 tablet (12.5 mg total) by mouth at  bedtime as needed for sleep., Disp: 30 tablet, Rfl: 2  No Known Allergies  Review of Systems  Constitutional: Negative for fever, chills and weight loss.  HENT: Negative for congestion, hearing loss, sore throat and tinnitus.   Eyes: Negative for blurred vision, double vision and redness.  Respiratory: Negative for cough, hemoptysis and shortness of breath.   Cardiovascular: Negative for chest pain, palpitations, orthopnea, claudication and leg swelling.  Gastrointestinal: Negative for heartburn, nausea, vomiting, diarrhea, constipation and blood in stool.  Genitourinary: Negative for dysuria, urgency, frequency and hematuria.  Musculoskeletal: Positive for back pain. Negative for myalgias, joint pain, falls and neck  pain.  Skin: Negative for itching.  Neurological: Negative for dizziness, tingling, tremors, focal weakness, seizures, loss of consciousness, weakness and headaches.  Endo/Heme/Allergies: Does not bruise/bleed easily.  Psychiatric/Behavioral: Positive for depression. Negative for substance abuse. The patient is nervous/anxious. The patient does not have insomnia.      Objective  Filed Vitals:   09/13/15 1115  BP: 128/84  Pulse: 110  Temp: 98.1 F (36.7 C)  TempSrc: Oral  Resp: 18  Height: 5\' 9"  (1.753 m)  Weight: 240 lb 9.6 oz (109.135 kg)  SpO2: 98%     Physical Exam  Constitutional: He is oriented to person, place, and time.  Obese in no acute distress  HENT:  Head: Normocephalic.  Eyes: EOM are normal. Pupils are equal, round, and reactive to light.  Neck: Normal range of motion. Neck supple. No thyromegaly present.  Cardiovascular: Normal rate, regular rhythm and normal heart sounds.   No murmur heard. Pulmonary/Chest: Effort normal and breath sounds normal. No respiratory distress. He has no wheezes.  Abdominal: Soft. Bowel sounds are normal.  Musculoskeletal: He exhibits no edema.  Lower lumbar pain again as noted  Lymphadenopathy:    He has no cervical adenopathy.  Neurological: He is alert and oriented to person, place, and time. No cranial nerve deficit. Gait normal. Coordination normal.  Skin: Skin is warm and dry. No rash noted.  Psychiatric:  . He is chronically depressed with overlying anxiety      Assessment & Plan  1. Acute anxiety Stable - ALPRAZolam (XANAX) 1 MG tablet; Take 1 tablet (1 mg total) by mouth 3 (three) times daily.  Dispense: 93 tablet; Refill: 2 - TSH  2. Essential hypertension Well-controlled - Comprehensive Metabolic Panel (CMET)  3. Hyperlipidemia Labs needed - Comprehensive Metabolic Panel (CMET) - TSH - Cholesterol, Total  4. Chronic pain disorder Continue current regimen - HYDROcodone-acetaminophen (NORCO) 10-325  MG tablet; Take 1 tablet by mouth 3 (three) times daily.  Dispense: 93 tablet; Refill: 0 - HYDROcodone-acetaminophen (NORCO) 10-325 MG tablet; Take 1 tablet by mouth 3 (three) times daily.  Dispense: 93 tablet; Refill: 0 - HYDROcodone-acetaminophen (NORCO) 10-325 MG tablet; Take 1 tablet by mouth 3 (three) times daily.  Dispense: 93 tablet; Refill: 0

## 2015-09-14 ENCOUNTER — Telehealth: Payer: Self-pay | Admitting: Emergency Medicine

## 2015-09-14 LAB — COMPREHENSIVE METABOLIC PANEL
A/G RATIO: 1.4 (ref 1.1–2.5)
ALT: 19 IU/L (ref 0–44)
AST: 18 IU/L (ref 0–40)
Albumin: 4.3 g/dL (ref 3.5–5.5)
Alkaline Phosphatase: 125 IU/L — ABNORMAL HIGH (ref 39–117)
BUN/Creatinine Ratio: 12 (ref 9–20)
BUN: 13 mg/dL (ref 6–24)
CHLORIDE: 99 mmol/L (ref 96–106)
CO2: 22 mmol/L (ref 18–29)
Calcium: 9.2 mg/dL (ref 8.7–10.2)
Creatinine, Ser: 1.06 mg/dL (ref 0.76–1.27)
GFR calc non Af Amer: 83 mL/min/{1.73_m2} (ref >59)
GFR, EST AFRICAN AMERICAN: 95 mL/min/{1.73_m2} (ref >59)
Globulin, Total: 3.1 g/dL (ref 1.5–4.5)
Glucose: 239 mg/dL — ABNORMAL HIGH (ref 65–99)
POTASSIUM: 5 mmol/L (ref 3.5–5.2)
SODIUM: 139 mmol/L (ref 134–144)
Total Protein: 7.4 g/dL (ref 6.0–8.5)

## 2015-09-14 LAB — TSH: TSH: 0.244 u[IU]/mL — ABNORMAL LOW (ref 0.450–4.500)

## 2015-09-14 LAB — CHOLESTEROL, TOTAL: CHOLESTEROL TOTAL: 366 mg/dL — AB (ref 100–199)

## 2015-09-14 NOTE — Telephone Encounter (Signed)
Patient not taking medication as prescribed. Will start taking every day

## 2015-10-04 DIAGNOSIS — F419 Anxiety disorder, unspecified: Secondary | ICD-10-CM | POA: Insufficient documentation

## 2015-10-04 DIAGNOSIS — E785 Hyperlipidemia, unspecified: Secondary | ICD-10-CM | POA: Insufficient documentation

## 2015-10-04 DIAGNOSIS — G894 Chronic pain syndrome: Secondary | ICD-10-CM | POA: Insufficient documentation

## 2015-11-09 ENCOUNTER — Other Ambulatory Visit: Payer: Self-pay

## 2015-11-09 DIAGNOSIS — G894 Chronic pain syndrome: Secondary | ICD-10-CM

## 2015-11-09 MED ORDER — HYDROCODONE-ACETAMINOPHEN 10-325 MG PO TABS: 1.0000 | ORAL_TABLET | Freq: Three times a day (TID) | ORAL | Status: AC

## 2015-11-26 ENCOUNTER — Telehealth: Payer: Self-pay | Admitting: Family Medicine

## 2015-11-26 NOTE — Telephone Encounter (Signed)
Patient had appointment for 12-13-15 but [facility] He will need a refill on Hydrocodone which will run out on 12-14-15.

## 2015-11-30 NOTE — Telephone Encounter (Signed)
Script had been printed called and informed pt it was ready for pick up

## 2015-12-07 ENCOUNTER — Encounter: Payer: Self-pay | Admitting: Family Medicine

## 2015-12-07 ENCOUNTER — Telehealth: Payer: Self-pay | Admitting: Emergency Medicine

## 2015-12-07 NOTE — Telephone Encounter (Signed)
[facility] questions regarding the death of Randy Kim. Dr. Carlynn PurlSowles spoke with examiner.

## 2015-12-13 ENCOUNTER — Ambulatory Visit: Payer: Managed Care, Other (non HMO) | Admitting: Family Medicine

## 2015-12-25 DEATH — deceased

## 2016-01-24 ENCOUNTER — Ambulatory Visit: Payer: Managed Care, Other (non HMO) | Admitting: Family Medicine
